# Patient Record
Sex: Female | Born: 1985 | Hispanic: Yes | Marital: Single | Smoking: Never smoker
Health system: Southern US, Community
[De-identification: ages and names within clinical notes are randomized; demographics above are authoritative.]

---

## 2004-09-22 ENCOUNTER — Emergency Department: Payer: Self-pay | Admitting: Emergency Medicine

## 2004-09-23 ENCOUNTER — Ambulatory Visit: Payer: Self-pay | Admitting: Emergency Medicine

## 2004-09-26 ENCOUNTER — Ambulatory Visit: Payer: Self-pay | Admitting: Internal Medicine

## 2005-08-14 ENCOUNTER — Emergency Department: Payer: Self-pay | Admitting: Unknown Physician Specialty

## 2005-08-15 ENCOUNTER — Ambulatory Visit: Payer: Self-pay | Admitting: Unknown Physician Specialty

## 2006-02-25 ENCOUNTER — Observation Stay: Payer: Self-pay | Admitting: Obstetrics & Gynecology

## 2006-03-22 ENCOUNTER — Inpatient Hospital Stay: Payer: Self-pay

## 2006-03-22 ENCOUNTER — Ambulatory Visit: Payer: Self-pay | Admitting: Family Medicine

## 2007-04-08 ENCOUNTER — Emergency Department: Payer: Self-pay

## 2007-09-12 ENCOUNTER — Observation Stay: Payer: Self-pay

## 2010-10-17 ENCOUNTER — Emergency Department: Payer: Self-pay | Admitting: Emergency Medicine

## 2012-08-30 ENCOUNTER — Emergency Department: Payer: Self-pay | Admitting: Emergency Medicine

## 2012-08-30 LAB — CBC
HCT: 37.9 % (ref 35.0–47.0)
HGB: 12 g/dL (ref 12.0–16.0)
MCH: 24.4 pg — ABNORMAL LOW (ref 26.0–34.0)
MCV: 77 fL — ABNORMAL LOW (ref 80–100)
RBC: 4.93 10*6/uL (ref 3.80–5.20)
WBC: 7.1 10*3/uL (ref 3.6–11.0)

## 2012-08-30 LAB — URINALYSIS, COMPLETE
Ketone: NEGATIVE
Leukocyte Esterase: NEGATIVE
Nitrite: NEGATIVE
Protein: NEGATIVE
RBC,UR: 1 /HPF (ref 0–5)
Specific Gravity: 1.016 (ref 1.003–1.030)
Squamous Epithelial: 5
WBC UR: 1 /HPF (ref 0–5)

## 2012-08-30 LAB — COMPREHENSIVE METABOLIC PANEL
Albumin: 3.8 g/dL (ref 3.4–5.0)
Alkaline Phosphatase: 93 U/L (ref 50–136)
Anion Gap: 3 — ABNORMAL LOW (ref 7–16)
Bilirubin,Total: 0.4 mg/dL (ref 0.2–1.0)
Calcium, Total: 8.6 mg/dL (ref 8.5–10.1)
Chloride: 104 mmol/L (ref 98–107)
Co2: 29 mmol/L (ref 21–32)
Creatinine: 0.64 mg/dL (ref 0.60–1.30)
EGFR (African American): >60
EGFR (Non-African Amer.): >60
Glucose: 88 mg/dL (ref 65–99)
Osmolality: 271 (ref 275–301)
Potassium: 4 mmol/L (ref 3.5–5.1)
Sodium: 136 mmol/L (ref 136–145)

## 2012-08-30 LAB — LIPASE, BLOOD: Lipase: 178 U/L (ref 73–393)

## 2013-01-04 ENCOUNTER — Emergency Department: Payer: Self-pay | Admitting: Internal Medicine

## 2013-08-31 ENCOUNTER — Emergency Department: Payer: Self-pay | Admitting: Emergency Medicine

## 2014-09-10 ENCOUNTER — Emergency Department: Payer: Self-pay | Admitting: Emergency Medicine

## 2015-09-19 ENCOUNTER — Emergency Department
Admission: EM | Admit: 2015-09-19 | Discharge: 2015-09-19 | Disposition: A | Discharge status: Home / Self Care | Payer: Self-pay | Attending: Emergency Medicine | Admitting: Emergency Medicine

## 2015-09-19 ENCOUNTER — Encounter: Payer: Self-pay | Admitting: Emergency Medicine

## 2015-09-19 DIAGNOSIS — Z3202 Encounter for pregnancy test, result negative: Secondary | ICD-10-CM | POA: Insufficient documentation

## 2015-09-19 DIAGNOSIS — R51 Headache: Secondary | ICD-10-CM | POA: Insufficient documentation

## 2015-09-19 DIAGNOSIS — N938 Other specified abnormal uterine and vaginal bleeding: Secondary | ICD-10-CM | POA: Insufficient documentation

## 2015-09-19 DIAGNOSIS — N939 Abnormal uterine and vaginal bleeding, unspecified: Secondary | ICD-10-CM

## 2015-09-19 DIAGNOSIS — R110 Nausea: Secondary | ICD-10-CM | POA: Insufficient documentation

## 2015-09-19 LAB — URINALYSIS COMPLETE WITH MICROSCOPIC (ARMC ONLY)
Bacteria, UA: NONE SEEN
Bilirubin Urine: NEGATIVE
GLUCOSE, UA: NEGATIVE mg/dL
Ketones, ur: NEGATIVE mg/dL
Leukocytes, UA: NEGATIVE
NITRITE: NEGATIVE
PROTEIN: NEGATIVE mg/dL
SPECIFIC GRAVITY, URINE: 1.004 — AB (ref 1.005–1.030)
pH: 6 (ref 5.0–8.0)

## 2015-09-19 LAB — HCG, QUANTITATIVE, PREGNANCY: hCG, Beta Chain, Quant, S: <1 m[IU]/mL (ref <5)

## 2015-09-19 LAB — POCT PREGNANCY, URINE: Preg Test, Ur: NEGATIVE

## 2015-09-19 NOTE — ED Notes (Signed)
Patient presents to the ED with vaginal bleeding that began about 30 minutes to 1 hour ago.  Patient states her period is about 1 month late and she recently had a pregnancy test that was positive.  Patient states bleeding is more than just spotting.  Patient is in no obvious distress at this time.  Patient states over the past couple of weeks she has felt dizzy, had headaches, and had some nausea.  Denies dizziness at this time.  Patient reports last sexual intercourse was this morning.

## 2015-09-19 NOTE — Discharge Instructions (Signed)
Please follow-up with the health department for reevaluation this week. Pregnancy tests in the ER are both negative, though very early pregnancy cannot be completely excluded. Please follow up closely.  Please return to the emergency room right away if you are to develop a fever, severe nausea, you have pain that becomes severe or worsens, you are unable to keep food down, begin vomiting any dark or bloody fluid, you develop any dark or bloody stools, feel dehydrated, have vaginal discharge or pain, or other new concerns or symptoms arise.   Nuseas, Adulto (Nausea, Adult)  La nusea es la sensacin de Dentistmalestar en el estmago o de la necesidad de vomitar. No constituye una preocupacin seria en s misma, pero puede ser un signo de problemas mdicos ms graves. Si empeora, puede provocar vmitos. Si aparecen vmitos, hay riesgo de deshidratacin.  CAUSES   Infecciones por virus.  Intoxicacin alimentaria.  Medicamentos.  Embarazo.  Mareos por movimiento.  Cefaleas migraosas.  Estrs emocional.  Dolor intenso producido en Corporate treasurercualquier lugar.  Intoxicacin por alcohol. INSTRUCCIONES PARA EL CUIDADO EN EL HOGAR   Debe hacer reposo.  Pida instrucciones especficas a su mdico con respecto a la rehidratacin.  Consuma cantidades pequeas de alimentos y tome sorbos de lquidos con ms frecuencia.  [employer] todos los medicamentos como le indic el mdico. SOLICITE ATENCIN MDICA SI:   No mejora, o Goodlandempeora, despus de [address] de tratamiento.  Tiene cefalea. SOLICITE ATENCIN MDICA DE INMEDIATO SI:  Tiene fiebre.  Se desmaya.  Sigue vomitando u observa sangre en el vmito.  Se siente extremadamente dbil o deshidratado.  La materia fecal es negra o tiene [place]re.  Siente dolor intenso en el pecho o en el abdomen. ASEGRESE DE QUE:   Comprende estas instrucciones.  Controlar su enfermedad.  Solicitar ayuda de inmediato si no mejora o si empeora.   Esta informacin no  tiene Theme park managercomo fin reemplazar el consejo del mdico. Asegrese de hacerle al mdico cualquier pregunta que tenga.   Document Released: 08/21/2005 Document Revised: 05/15/2012 Elsevier Interactive Patient Education [employer]2016 Elsevier Inc.

## 2015-09-19 NOTE — ED Provider Notes (Signed)
[facility] Emergency Department Provider Note ____________________________________________  Time seen: Approximately 6:22 PM  I have reviewed the triage vital signs and the nursing notes.   HISTORY  Chief Complaint No chief complaint on file.  possibly pregnant.  History and exam performed with Spanish interpreter Trudy   HPI Monica Cooper is a 30 y.o. female reports 3 previous pregnancies and one C-section. Patient reports that her last period was about one month ago, and that she's been having some slight vaginal bleeding and occasional nausea.She is also had what she describes as spotting the last couple of days, slightly more than the normal. She also occasionally has been having some mild headaches for the last week.  Patient reports that she believes she is pregnant. She took a pregnancy test about a week ago and states that it was positive. She reports she's had all these similar symptoms with her prior pregnancies.  She denies being in pain. No fevers chills or vaginal discharge or pain with intercourse.   History reviewed. No pertinent past medical history.  There are no active problems to display for this patient.   Past Surgical History  Procedure Laterality Date  . Cesarean section      No current outpatient prescriptions on file.  Allergies Review of patient's allergies indicates no known allergies.  No family history on file.  Social History Social History  Substance Use Topics  . Smoking status: Never Smoker   . Smokeless tobacco: None  . Alcohol Use: No    Review of Systems Constitutional: No fever/chills Eyes: No visual changes. ENT: No sore throat. Cardiovascular: Denies chest pain. Respiratory: Denies shortness of breath. Gastrointestinal: No abdominal pain.  no vomiting.  No diarrhea.  No constipation. Genitourinary: Negative for dysuria. Musculoskeletal: Negative for back pain. Skin: Negative for  rash. Neurological: Negative for headaches, focal weakness or numbness.  10-point ROS otherwise negative.  ____________________________________________   PHYSICAL EXAM:  VITAL SIGNS: ED Triage Vitals  Enc Vitals Group     BP 09/19/15 1524 107/60 mmHg     Pulse Rate 09/19/15 1524 67     Resp 09/19/15 1524 20     Temp 09/19/15 1524 98.2 F (36.8 C)     Temp Source 09/19/15 1524 Oral     SpO2 09/19/15 1524 100 %     Weight 09/19/15 1524 140 lb (63.504 kg)     Height 09/19/15 1524  (1.575 m)     Head Cir --      Peak Flow --      Pain Score 09/19/15 1540 4     Pain Loc --      Pain Edu? --      Excl. in GC? --    Constitutional: Alert and oriented. Well appearing and in no acute distress. Eyes: Conjunctivae are normal. PERRL. EOMI. Head: Atraumatic. Nose: No congestion/rhinnorhea. Mouth/Throat: Mucous membranes are moist.  Oropharynx non-erythematous. Neck: No stridor.   Cardiovascular: Normal rate, regular rhythm. Grossly normal heart sounds.  Good peripheral circulation. Respiratory: Normal respiratory effort.  No retractions. Lungs CTAB. Gastrointestinal: Soft and nontender. No distention. No CVA tenderness. Musculoskeletal: No lower extremity tenderness nor edema.  No joint effusions. Neurologic:  Normal speech and language. No gross focal neurologic deficits are appreciated. No gait instability. Skin:  Skin is warm, dry and intact. No rash noted. Psychiatric: Mood and affect are normal. Speech and behavior are normal.  ____________________________________________   LABS (all labs ordered are listed, but only abnormal results  are displayed)  Labs Reviewed  URINALYSIS COMPLETEWITH MICROSCOPIC ([facility] ONLY) - Abnormal; Notable for the following:    Color, Urine COLORLESS (*)    APPearance CLEAR (*)    Specific Gravity, Urine 1.004 (*)    Hgb urine dipstick 1+ (*)    Squamous Epithelial / LPF 0-5 (*)    All other components within normal limits  HCG,  QUANTITATIVE, PREGNANCY  POC URINE PREG, ED  POCT PREGNANCY, URINE   ____________________________________________  EKG   ____________________________________________  RADIOLOGY  The present time given the patient's negative pregnancy test in the ER, no abdominal pain, and reassuring abdominal exam I do not believe there is an emergent indication for abdominal imaging. ____________________________________________   PROCEDURES  Procedure(s) performed: None  Critical Care performed: No  ____________________________________________   INITIAL IMPRESSION / ASSESSMENT AND PLAN / ED COURSE  Pertinent labs & imaging results that were available during my care of the patient were reviewed by me and considered in my medical decision making (see chart for details).  Patient presents for evaluation of some slightly increased vaginal bleeding, but one month, date of her last menstrual period she was concerned that she may be pregnant, but she has 2 negative pregnancy tests in the ER including urine and blood. This point there is no evidence of clear pregnancy, but I did discuss with her based on her symptoms possible she is in very early pregnancy I find this to be unlikely. She will follow-up closely with the health department, at this time there is nothing to support a clear pregnancy or need for abdominal imaging. Very reassuring exam. Urinalysis negative.  Patient denies any lower abdominal pain, no vaginal discharge other than spotting that is just slightly more than normal menses. At this point, I recommend to the patient close outpatient follow-up with the [facility] health Department and instructed on careful return precautions for which she is agreeable.   ____________________________________________   FINAL CLINICAL IMPRESSION(S) / ED DIAGNOSES  Final diagnoses:  Nausea  Vaginal bleeding problems      Sharyn Creamer, MD 09/19/15 1849

## 2016-08-15 ENCOUNTER — Emergency Department
Admission: EM | Admit: 2016-08-15 | Discharge: 2016-08-15 | Disposition: A | Discharge status: Home / Self Care | Payer: No Typology Code available for payment source | Attending: Emergency Medicine | Admitting: Emergency Medicine

## 2016-08-15 ENCOUNTER — Encounter: Payer: Self-pay | Admitting: Emergency Medicine

## 2016-08-15 ENCOUNTER — Emergency Department: Payer: No Typology Code available for payment source

## 2016-08-15 DIAGNOSIS — S199XXA Unspecified injury of neck, initial encounter: Secondary | ICD-10-CM | POA: Diagnosis present

## 2016-08-15 DIAGNOSIS — M25512 Pain in left shoulder: Secondary | ICD-10-CM | POA: Insufficient documentation

## 2016-08-15 DIAGNOSIS — T148XXA Other injury of unspecified body region, initial encounter: Secondary | ICD-10-CM | POA: Diagnosis not present

## 2016-08-15 DIAGNOSIS — Y999 Unspecified external cause status: Secondary | ICD-10-CM | POA: Insufficient documentation

## 2016-08-15 DIAGNOSIS — Y9389 Activity, other specified: Secondary | ICD-10-CM | POA: Insufficient documentation

## 2016-08-15 DIAGNOSIS — Y929 Unspecified place or not applicable: Secondary | ICD-10-CM | POA: Insufficient documentation

## 2016-08-15 DIAGNOSIS — M542 Cervicalgia: Secondary | ICD-10-CM | POA: Insufficient documentation

## 2016-08-15 DIAGNOSIS — M545 Low back pain: Secondary | ICD-10-CM | POA: Diagnosis not present

## 2016-08-15 DIAGNOSIS — M7918 Myalgia, other site: Secondary | ICD-10-CM

## 2016-08-15 DIAGNOSIS — T07XXXA Unspecified multiple injuries, initial encounter: Secondary | ICD-10-CM

## 2016-08-15 LAB — URINALYSIS, COMPLETE (UACMP) WITH MICROSCOPIC
Bilirubin Urine: NEGATIVE
Glucose, UA: NEGATIVE mg/dL
Hgb urine dipstick: NEGATIVE
Ketones, ur: NEGATIVE mg/dL
Leukocytes, UA: NEGATIVE
Nitrite: NEGATIVE
PROTEIN: 100 mg/dL — AB
SPECIFIC GRAVITY, URINE: 1.018 (ref 1.005–1.030)
pH: 8 (ref 5.0–8.0)

## 2016-08-15 LAB — POCT PREGNANCY, URINE: PREG TEST UR: NEGATIVE

## 2016-08-15 MED ORDER — NAPROXEN 500 MG PO TABS: 500.0000 mg | ORAL_TABLET | Freq: Two times a day (BID) | ORAL | 0 refills | Status: AC

## 2016-08-15 MED ORDER — BACLOFEN 10 MG PO TABS: 10.0000 mg | ORAL_TABLET | Freq: Three times a day (TID) | ORAL | 0 refills | Status: AC
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
## 2017-05-08 ENCOUNTER — Encounter: Payer: Self-pay | Admitting: Emergency Medicine

## 2017-05-08 ENCOUNTER — Emergency Department: Payer: No Typology Code available for payment source

## 2017-05-08 ENCOUNTER — Emergency Department
Admission: EM | Admit: 2017-05-08 | Discharge: 2017-05-08 | Disposition: A | Discharge status: Home / Self Care | Payer: No Typology Code available for payment source | Attending: Emergency Medicine | Admitting: Emergency Medicine

## 2017-05-08 DIAGNOSIS — M79602 Pain in left arm: Secondary | ICD-10-CM | POA: Diagnosis not present

## 2017-05-08 DIAGNOSIS — Y939 Activity, unspecified: Secondary | ICD-10-CM | POA: Insufficient documentation

## 2017-05-08 DIAGNOSIS — Y929 Unspecified place or not applicable: Secondary | ICD-10-CM | POA: Diagnosis not present

## 2017-05-08 DIAGNOSIS — Y999 Unspecified external cause status: Secondary | ICD-10-CM | POA: Diagnosis not present

## 2017-05-08 DIAGNOSIS — M542 Cervicalgia: Secondary | ICD-10-CM | POA: Diagnosis not present

## 2017-05-08 MED ORDER — IBUPROFEN 800 MG PO TABS
800.0000 mg | ORAL_TABLET | Freq: Once | ORAL | Status: AC
  Administered 2017-05-08: 800 mg via ORAL
  Filled 2017-05-08: qty 1

## 2017-05-08 MED ORDER — CYCLOBENZAPRINE HCL 5 MG PO TABS: 5.0000 mg | ORAL_TABLET | Freq: Every day | ORAL | 0 refills | Status: AC

## 2017-05-08 NOTE — ED Notes (Signed)
ED Provider at bedside. 
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
## 2017-05-08 NOTE — ED Notes (Signed)
Interpreter at bedside.
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
## 2017-05-08 NOTE — ED Notes (Signed)
Lights dimmed for pt comfort

## 2017-05-08 NOTE — ED Notes (Signed)
Patient transported to X-ray 
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
## 2017-05-10 ENCOUNTER — Emergency Department
Admission: EM | Admit: 2017-05-10 | Discharge: 2017-05-10 | Disposition: A | Discharge status: Home / Self Care | Payer: No Typology Code available for payment source | Attending: Emergency Medicine | Admitting: Emergency Medicine

## 2017-05-10 ENCOUNTER — Encounter: Payer: Self-pay | Admitting: Emergency Medicine

## 2017-05-10 DIAGNOSIS — M62838 Other muscle spasm: Secondary | ICD-10-CM | POA: Insufficient documentation

## 2017-05-10 DIAGNOSIS — Z79899 Other long term (current) drug therapy: Secondary | ICD-10-CM | POA: Insufficient documentation

## 2017-05-10 DIAGNOSIS — Y92410 Unspecified street and highway as the place of occurrence of the external cause: Secondary | ICD-10-CM | POA: Insufficient documentation

## 2017-05-10 DIAGNOSIS — Y999 Unspecified external cause status: Secondary | ICD-10-CM | POA: Diagnosis not present

## 2017-05-10 DIAGNOSIS — S39012A Strain of muscle, fascia and tendon of lower back, initial encounter: Secondary | ICD-10-CM | POA: Diagnosis not present

## 2017-05-10 DIAGNOSIS — Y939 Activity, unspecified: Secondary | ICD-10-CM | POA: Insufficient documentation

## 2017-05-10 DIAGNOSIS — S161XXA Strain of muscle, fascia and tendon at neck level, initial encounter: Secondary | ICD-10-CM | POA: Insufficient documentation

## 2017-05-10 DIAGNOSIS — S3992XA Unspecified injury of lower back, initial encounter: Secondary | ICD-10-CM | POA: Diagnosis present

## 2017-05-10 MED ORDER — KETOROLAC TROMETHAMINE 10 MG PO TABS: 10.0000 mg | ORAL_TABLET | Freq: Four times a day (QID) | ORAL | 0 refills | Status: AC | PRN

## 2017-05-10 MED ORDER — CARISOPRODOL 350 MG PO TABS: 350.0000 mg | ORAL_TABLET | Freq: Three times a day (TID) | ORAL | 1 refills | Status: AC | PRN

## 2017-05-10 MED ORDER — KETOROLAC TROMETHAMINE 30 MG/ML IJ SOLN
30.0000 mg | Freq: Once | INTRAMUSCULAR | Status: AC
  Administered 2017-05-10: 30 mg via INTRAMUSCULAR
  Filled 2017-05-10: qty 1

## 2017-05-10 MED ORDER — METHYLPREDNISOLONE SODIUM SUCC 125 MG IJ SOLR: 125.0000 mg | Freq: Once | INTRAMUSCULAR | Status: DC
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
## 2017-05-10 NOTE — Discharge Instructions (Signed)
Take medication as prescribed. Return to emergency department if symptoms worsen and follow-up with PCP as needed.    In addition to the prescribed medications he may also use cold and heat therapy for symptom management.
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
## 2017-05-10 NOTE — ED Notes (Signed)
Medical intepeter paged
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
## 2019-03-27 ENCOUNTER — Other Ambulatory Visit: Payer: Self-pay

## 2019-03-27 DIAGNOSIS — Z20822 Contact with and (suspected) exposure to covid-19: Secondary | ICD-10-CM

## 2019-03-30 LAB — NOVEL CORONAVIRUS, NAA: SARS-CoV-2, NAA: DETECTED — AB

## 2021-05-19 ENCOUNTER — Other Ambulatory Visit: Payer: Self-pay

## 2021-05-19 ENCOUNTER — Emergency Department
Admission: EM | Admit: 2021-05-19 | Discharge: 2021-05-19 | Disposition: A | Discharge status: Home / Self Care | Payer: No Typology Code available for payment source | Attending: Emergency Medicine | Admitting: Emergency Medicine

## 2021-05-19 ENCOUNTER — Emergency Department: Payer: No Typology Code available for payment source

## 2021-05-19 DIAGNOSIS — S6392XA Sprain of unspecified part of left wrist and hand, initial encounter: Secondary | ICD-10-CM | POA: Insufficient documentation

## 2021-05-19 DIAGNOSIS — S63502A Unspecified sprain of left wrist, initial encounter: Secondary | ICD-10-CM

## 2021-05-19 DIAGNOSIS — S6992XA Unspecified injury of left wrist, hand and finger(s), initial encounter: Secondary | ICD-10-CM | POA: Diagnosis present

## 2021-05-19 DIAGNOSIS — M25532 Pain in left wrist: Secondary | ICD-10-CM | POA: Insufficient documentation

## 2021-05-19 DIAGNOSIS — Y92410 Unspecified street and highway as the place of occurrence of the external cause: Secondary | ICD-10-CM | POA: Insufficient documentation

## 2021-05-19 MED ORDER — IBUPROFEN 600 MG PO TABS
600.0000 mg | ORAL_TABLET | Freq: Once | ORAL | Status: AC
  Administered 2021-05-19: 600 mg via ORAL
  Filled 2021-05-19: qty 1

## 2021-05-19 MED ORDER — ACETAMINOPHEN 500 MG PO TABS
1000.0000 mg | ORAL_TABLET | Freq: Once | ORAL | Status: AC
  Administered 2021-05-19: 1000 mg via ORAL
  Filled 2021-05-19: qty 2
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
## 2021-05-19 NOTE — Discharge Instructions (Addendum)
Please take Tylenol and ibuprofen/Advil for your pain.  It is safe to take them together, or to alternate them every few hours.  Take up to 1000mg of Tylenol at a time, up to 4 times per day.  Do not take more than 4000 mg of Tylenol in 24 hours.  For ibuprofen, take 400-600 mg, 4-5 times per day. ° ° °
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
## 2021-05-19 NOTE — ED Notes (Signed)
ED provider at bedside.
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
